# Patient Record
Sex: Male | Born: 1970 | Race: Black or African American | Hispanic: No | Marital: Single | Smoking: Current every day smoker
Health system: Southern US, Community
[De-identification: ages and names within clinical notes are randomized; demographics above are authoritative.]

---

## 2013-11-13 ENCOUNTER — Ambulatory Visit: Payer: Self-pay | Admitting: General Practice

## 2016-02-07 IMAGING — CR DG CHEST 2V
1 series · 2 of 2 positions shown · non-contrast
Comparison: None.

CLINICAL DATA: Health screening ; history of tobacco use

EXAM:
CHEST  2 VIEW

[Series 1: kdxr chest 2 views (person_name) · 0.14mm/px · 2 of 2 slices shown]
[im 1/2]
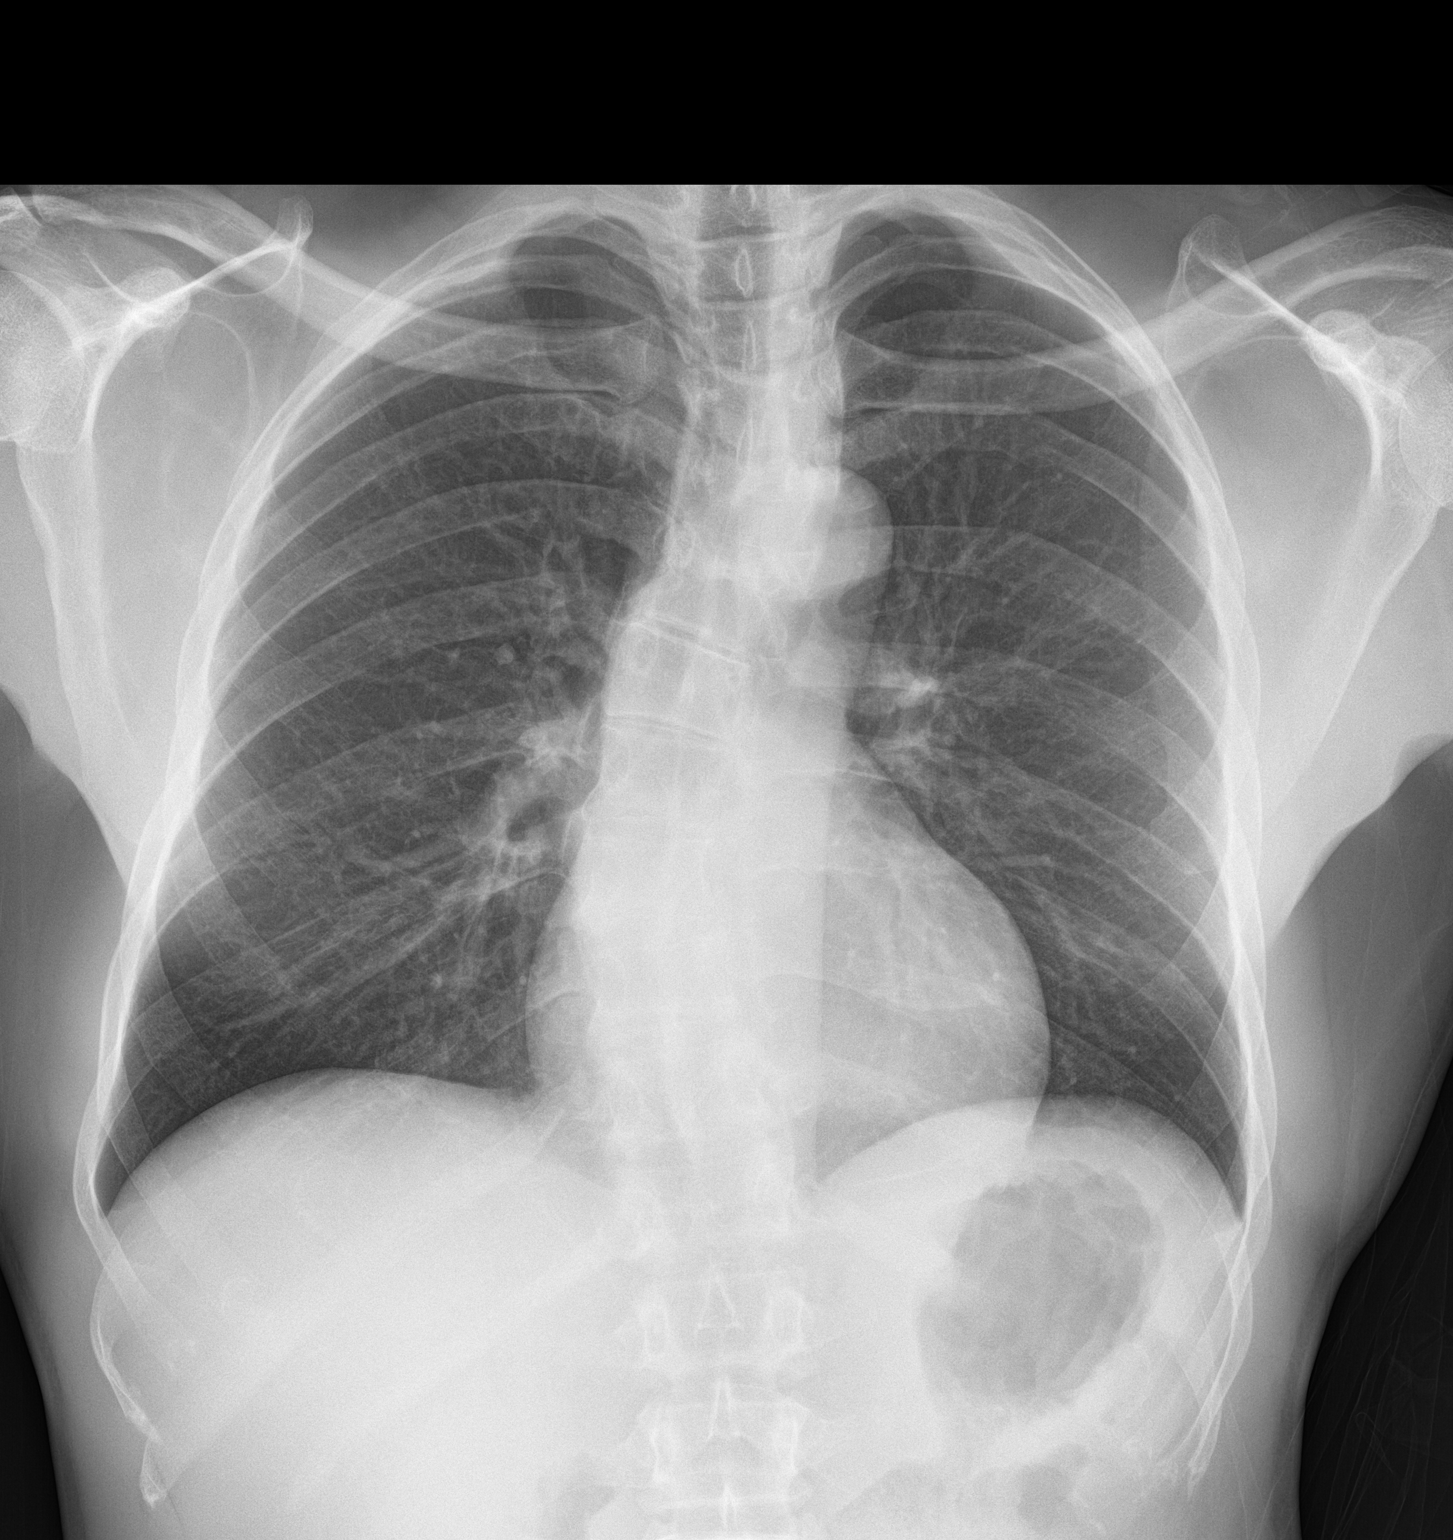
[im 2/2]
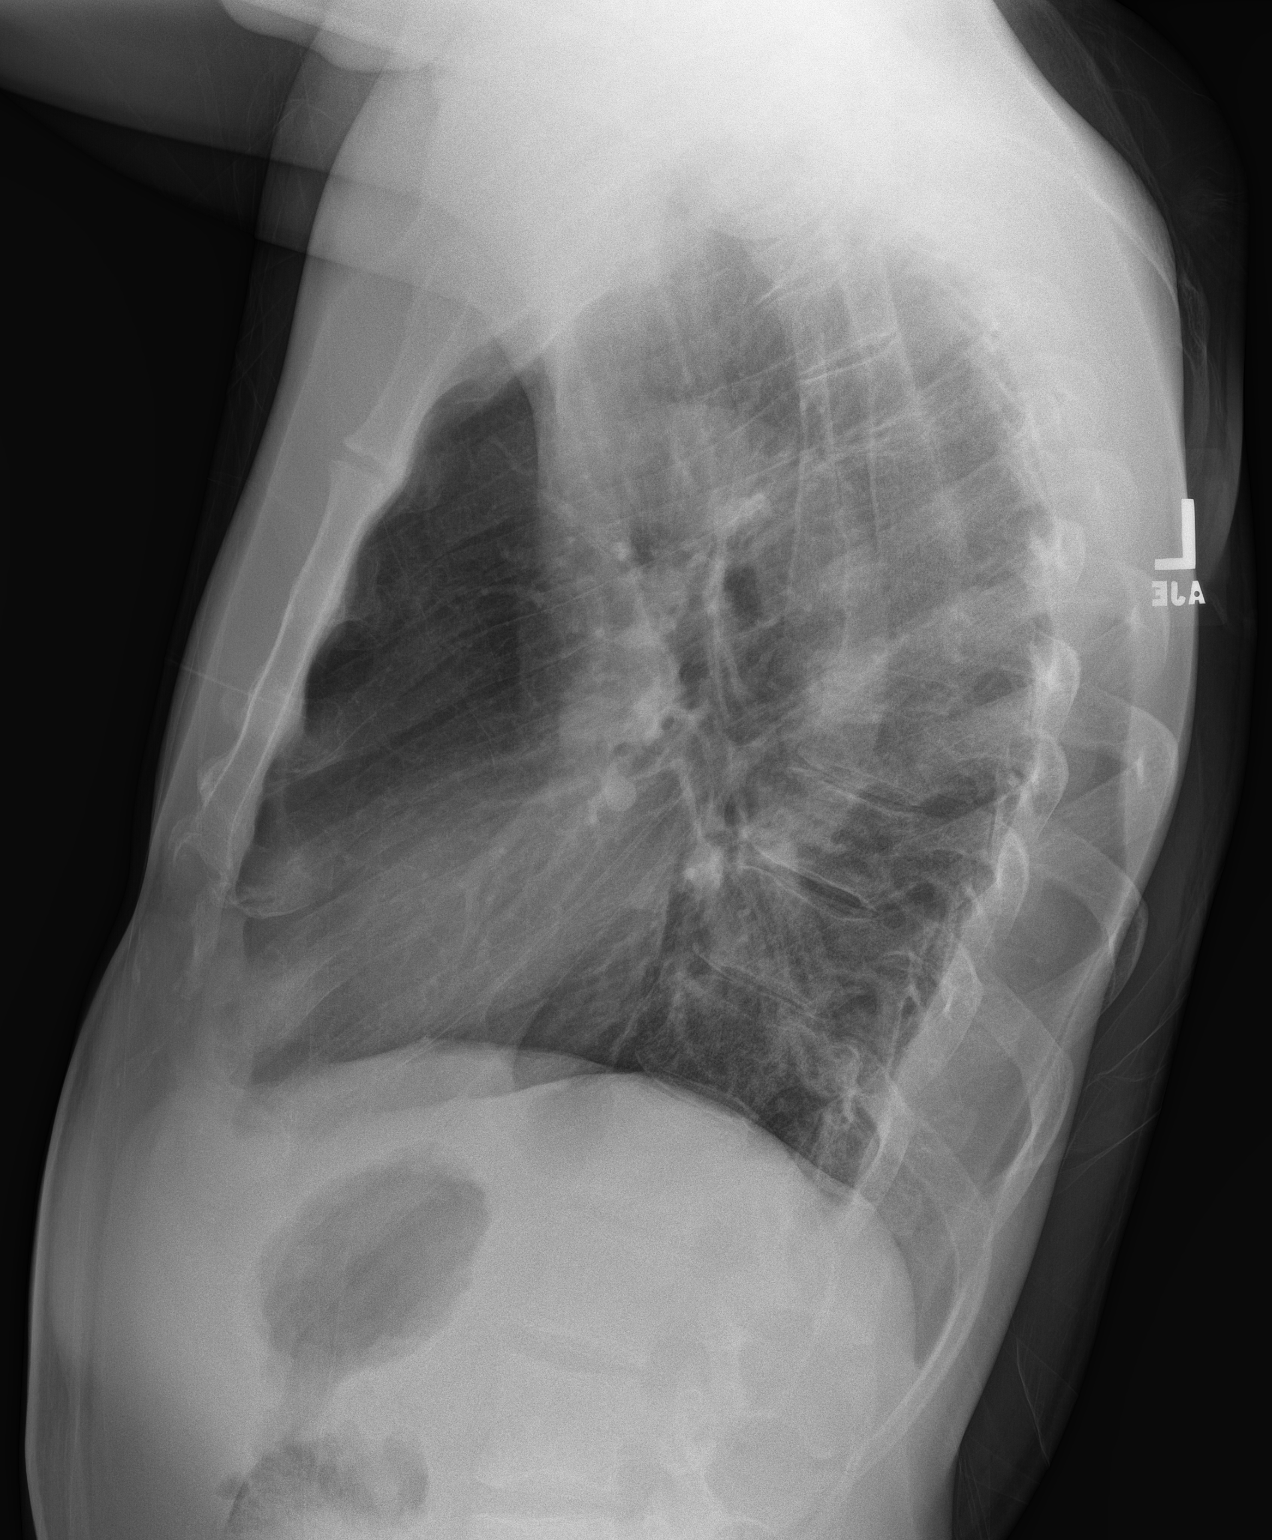

[2 of 2 positions shown; findings below may reference images not displayed]

FINDINGS: Lungs are clear. Heart size and pulmonary vascularity are normal. No
adenopathy. There is upper thoracic levoscoliosis with mid thoracic
dextroscoliosis.
IMPRESSION: Lungs clear.  There is scoliosis in the thoracic region.

## 2016-12-13 ENCOUNTER — Encounter: Payer: Self-pay | Admitting: Emergency Medicine

## 2016-12-13 ENCOUNTER — Emergency Department
Admission: EM | Admit: 2016-12-13 | Discharge: 2016-12-13 | Disposition: A | Discharge status: Home / Self Care | Attending: Emergency Medicine | Admitting: Emergency Medicine

## 2016-12-13 ENCOUNTER — Encounter
Admission: EM | Disposition: A | Discharge status: Home / Self Care | Payer: Self-pay | Source: Home / Self Care | Attending: Emergency Medicine

## 2016-12-13 ENCOUNTER — Emergency Department

## 2016-12-13 DIAGNOSIS — R079 Chest pain, unspecified: Secondary | ICD-10-CM

## 2016-12-13 DIAGNOSIS — R071 Chest pain on breathing: Secondary | ICD-10-CM | POA: Diagnosis present

## 2016-12-13 DIAGNOSIS — F17200 Nicotine dependence, unspecified, uncomplicated: Secondary | ICD-10-CM | POA: Diagnosis not present

## 2016-12-13 LAB — COMPREHENSIVE METABOLIC PANEL
ALBUMIN: 4.6 g/dL (ref 3.5–5.0)
ALT: 23 U/L (ref 17–63)
ANION GAP: 7 (ref 5–15)
AST: 21 U/L (ref 15–41)
Alkaline Phosphatase: 71 U/L (ref 38–126)
BUN: 11 mg/dL (ref 6–20)
CALCIUM: 9.6 mg/dL (ref 8.9–10.3)
CHLORIDE: 104 mmol/L (ref 101–111)
CO2: 29 mmol/L (ref 22–32)
Creatinine, Ser: 1.03 mg/dL (ref 0.61–1.24)
GFR calc non Af Amer: >60 mL/min (ref >60)
Glucose, Bld: 96 mg/dL (ref 65–99)
POTASSIUM: 3.9 mmol/L (ref 3.5–5.1)
SODIUM: 140 mmol/L (ref 135–145)
Total Bilirubin: 1.2 mg/dL (ref 0.3–1.2)
Total Protein: 7.6 g/dL (ref 6.5–8.1)

## 2016-12-13 LAB — CBC WITH DIFFERENTIAL/PLATELET
Basophils Absolute: 0.1 10*3/uL (ref 0–0.1)
Basophils Relative: 1 %
EOS ABS: 0.1 10*3/uL (ref 0–0.7)
Eosinophils Relative: 1 %
HCT: 41.2 % (ref 40.0–52.0)
HEMOGLOBIN: 13.8 g/dL (ref 13.0–18.0)
LYMPHS ABS: 2.9 10*3/uL (ref 1.0–3.6)
LYMPHS PCT: 59 %
MCH: 31.7 pg (ref 26.0–34.0)
MCHC: 33.4 g/dL (ref 32.0–36.0)
MCV: 94.8 fL (ref 80.0–100.0)
MONOS PCT: 9 %
Monocytes Absolute: 0.4 10*3/uL (ref 0.2–1.0)
NEUTROS PCT: 30 %
Neutro Abs: 1.5 10*3/uL (ref 1.4–6.5)
PLATELETS: 183 10*3/uL (ref 150–440)
RBC: 4.34 MIL/uL — ABNORMAL LOW (ref 4.40–5.90)
RDW: 13.3 % (ref 11.5–14.5)
WBC: 5 10*3/uL (ref 3.8–10.6)

## 2016-12-13 LAB — PROTIME-INR
INR: 1.01
PROTHROMBIN TIME: 13.2 s (ref 11.4–15.2)

## 2016-12-13 LAB — LIPID PANEL
CHOLESTEROL: 210 mg/dL — AB (ref 0–200)
HDL: 80 mg/dL (ref >40)
LDL Cholesterol: 121 mg/dL — ABNORMAL HIGH (ref 0–99)
TRIGLYCERIDES: 46 mg/dL (ref <150)
Total CHOL/HDL Ratio: 2.6 RATIO
VLDL: 9 mg/dL (ref 0–40)

## 2016-12-13 LAB — APTT

## 2016-12-13 LAB — TROPONIN I
Troponin I: 0.04 ng/mL (ref <0.03)
Troponin I: 0.03 ng/mL (ref <0.03)

## 2016-12-13 LAB — FIBRIN DERIVATIVES D-DIMER (ARMC ONLY): Fibrin derivatives D-dimer (ARMC): 217.07 ng/mL (FEU) (ref 0.00–499.00)

## 2016-12-13 SURGERY — CORONARY/GRAFT ACUTE MI REVASCULARIZATION
Anesthesia: Moderate Sedation

## 2016-12-13 MED ORDER — ASPIRIN 81 MG PO CHEW: 324.0000 mg | CHEWABLE_TABLET | Freq: Once | ORAL | Status: DC

## 2016-12-13 MED ORDER — NITROGLYCERIN 5 MG/ML IV SOLN
INTRAVENOUS | Status: AC
  Filled 2016-12-13: qty 10

## 2016-12-13 MED ORDER — LIDOCAINE HCL (PF) 1 % IJ SOLN
INTRAMUSCULAR | Status: AC
  Filled 2016-12-13: qty 30

## 2016-12-13 MED ORDER — GI COCKTAIL ~~LOC~~
30.0000 mL | Freq: Once | ORAL | Status: AC
  Administered 2016-12-13: 30 mL via ORAL
  Filled 2016-12-13: qty 30

## 2016-12-13 MED ORDER — HEPARIN SODIUM (PORCINE) 5000 UNIT/ML IJ SOLN
INTRAMUSCULAR | Status: AC
  Filled 2016-12-13: qty 1

## 2016-12-13 MED ORDER — KETOROLAC TROMETHAMINE 30 MG/ML IJ SOLN
30.0000 mg | Freq: Once | INTRAMUSCULAR | Status: AC
  Administered 2016-12-13: 30 mg via INTRAVENOUS
  Filled 2016-12-13: qty 1

## 2016-12-13 MED ORDER — HEPARIN SODIUM (PORCINE) 5000 UNIT/ML IJ SOLN
4000.0000 [IU] | Freq: Once | INTRAMUSCULAR | Status: AC
  Administered 2016-12-13: 4000 [IU] via INTRAVENOUS

## 2016-12-13 NOTE — Discharge Instructions (Signed)
Please follow up with to further evaluate your chest pain

## 2016-12-13 NOTE — ED Notes (Signed)
Pt given graham crackers, PB, and ginger ale.

## 2016-12-13 NOTE — ED Provider Notes (Signed)
[facility] Emergency Department Provider Note   ____________________________________________   First MD Initiated Contact with Patient 12/13/16 0210     (approximate)  I have reviewed the triage vital signs and the nursing notes.   HISTORY  Chief Complaint Code STEMI    HPI Burns Jenetta Downer Schreier is a 46 y.o. male who comes into the hospital today with some chest pain. The patient is currently residing in the correctional facility and calves will [place]. He states that he was laying down to sleep when around 11:30 he developed some left-sided chest pain. He reports that it only hurting when he takes a deep breath. The patient reports that the pain is a 7-8 out of 10 in intensity.He does not have any medical problems. He reports that with the pain he does not have any shortness of breath, nausea, vomiting, sweats dizziness or lightheadedness. The pain does not radiate or move anywhere. The patient did receive an EKG and EMS was concerned about a possible ST segment elevation MI. the patient was brought in under alert given his chest pain.   History reviewed. No pertinent past medical history.  There are no active problems to display for this patient.   History reviewed. No pertinent surgical history.  Prior to Admission medications   Not on File    Allergies Patient has no known allergies.  No family history on file.  Social History Social History  Substance Use Topics  . Smoking status: Current Every Day Smoker  . Smokeless tobacco: Not on file  . Alcohol use No    Review of Systems  Constitutional: No fever/chills Eyes: No visual changes. ENT: No sore throat. Cardiovascular:  chest pain. Respiratory: Denies shortness of breath. Gastrointestinal: No abdominal pain.  No nausea, no vomiting.  No diarrhea.  No constipation. Genitourinary: Negative for dysuria. Musculoskeletal: Negative for back pain. Skin: Negative for rash. Neurological:  Negative for headaches, focal weakness or numbness.   ____________________________________________   PHYSICAL EXAM:  VITAL SIGNS: ED Triage Vitals  Enc Vitals Group     BP 12/13/16 0214 (!) 163/104     Pulse Rate 12/13/16 0214 (!) 56     Resp 12/13/16 0214 12     Temp 12/13/16 0214 98.2 F (36.8 C)     Temp Source 12/13/16 0214 Oral     SpO2 12/13/16 0214 100 %     Weight 12/13/16 0217 156 lb 4.8 oz (70.9 kg)     Height 12/13/16 0217 5' 6"  (1.676 m)     Head Circumference --      Peak Flow --      Pain Score 12/13/16 0213 8     Pain Loc --      Pain Edu? --      Excl. in [place]? --     Constitutional: Alert and oriented. Well appearing and in mild distress. Eyes: Conjunctivae are normal. PERRL. EOMI. Head: Atraumatic. Nose: No congestion/rhinnorhea. Mouth/Throat: Mucous membranes are moist.  Oropharynx non-erythematous. Cardiovascular: Normal rate, regular rhythm. Grossly normal heart sounds.  Good peripheral circulation. Respiratory: Normal respiratory effort.  No retractions. Lungs CTAB. Gastrointestinal: Soft and nontender. No distention. positive bowel sounds Musculoskeletal: No lower extremity tenderness nor edema.  Neurologic:  Normal speech and language.  Skin:  Skin is warm, dry and intact.  Psychiatric: Mood and affect are normal.   ____________________________________________   LABS (all labs ordered are listed, but only abnormal results are displayed)  Labs Reviewed  CBC WITH DIFFERENTIAL/PLATELET - Abnormal;  Notable for the following:       Result Value   RBC 4.34 (*)    All other components within normal limits  APTT - Abnormal; Notable for the following:    aPTT >160 (*)    All other components within normal limits  TROPONIN I - Abnormal; Notable for the following:    Troponin I 0.04 (*)    All other components within normal limits  LIPID PANEL - Abnormal; Notable for the following:    Cholesterol 210 (*)    LDL Cholesterol 121 (*)    All other  components within normal limits  TROPONIN I - Abnormal; Notable for the following:    Troponin I 0.03 (*)    All other components within normal limits  PROTIME-INR  COMPREHENSIVE METABOLIC PANEL  FIBRIN DERIVATIVES D-DIMER ([facility] ONLY)   ____________________________________________  EKG  ED ECG REPORT I, Loney Hering, the attending physician, personally viewed and interpreted this ECG.   Date: 12/13/2016  EKG Time: 216  Rate: 56  Rhythm: normal sinus rhythm  Axis: normal  Intervals:none  ST&T Change: no STEMI  ____________________________________________  RADIOLOGY  Dg Chest 2 View  Result Date: 12/13/2016 CLINICAL DATA:  LEFT chest pain since last night. EXAM: CHEST  2 VIEW COMPARISON:  Chest radiograph November 13, 2013 FINDINGS: Cardiomediastinal silhouette is normal. No pleural effusions or focal consolidations. Trachea projects midline and there is no pneumothorax. Soft tissue planes and included osseous structures are non-suspicious. Unchanged moderate lower thoracic dextroscoliosis. IMPRESSION: Stable examination:  No acute cardiopulmonary process. Electronically Signed   By: Elon Alas M.D.   On: 12/13/2016 02:57    ____________________________________________   PROCEDURES  Procedure(s) performed: None  Procedures  Critical Care performed: No  ____________________________________________   INITIAL IMPRESSION / ASSESSMENT AND PLAN / ED COURSE  As part of my medical decision making, I reviewed the following data within the electronic MEDICAL RECORD NUMBER Notes from prior ED visits and [place] Controlled Substance Database   This is a 46 year old male who comes into the hospital today with some chest pain. The patient was brought in with a concern for STEMI  My differential diagnosis includes STEMI, NSTEMI, nonspecific chest pain, GERD  When the patient arrived we did evaluate the EKG from EMS and it did not meet STEMI criteria. The patient had one  box of elevation in V2 and V3 with no reciprocal changes. We did repeat the EKG which again did not meet STEMI criteria. The patient also was seen by Dr. Towanda Malkin the cardiologist on-call and he also did not feel that the EKG met STEMI criteria. We did give the patient a dose of heparin when he initially arrived with a concern for STEMI but did not give any further medications. We did check some blood work to include a d-dimer. The patient did receive some aspirin by EMS and some Toradol and GI cocktail here in the emergency department. The patient's blood work and workup was unremarkable. We also repeated the bone and it was negative. The patient will be discharged back to his facility and should follow up with cardiology.      ____________________________________________   FINAL CLINICAL IMPRESSION(S) / ED DIAGNOSES  Final diagnoses:  Chest pain on breathing  Chest pain, unspecified type      NEW MEDICATIONS STARTED DURING THIS VISIT:  New Prescriptions   No medications on file     Note:  This document was prepared using Dragon voice recognition software and may include  unintentional dictation errors.    Loney Hering, MD 12/13/16 [id number]

## 2016-12-13 NOTE — ED Notes (Signed)
AAOx3.  Skin warm and dry.  NAD 

## 2016-12-13 NOTE — ED Triage Notes (Signed)
Pt reported left sided chest pain which began around 2330 this evening. Pt received 324 mg aspirin via [facility]. [facility]. Upon this time in triage, forensic restraints removed. Pt denies other cardiac symptoms at this time and is in NAD.

## 2019-03-09 IMAGING — CR DG CHEST 2V
1 series · 2 of 2 positions shown · non-contrast
Comparison: Chest radiograph November 13, 2013

CLINICAL DATA: LEFT chest pain since last night.

EXAM:
CHEST  2 VIEW

[Series 1: dg chest 2 view · 0.14mm/px · 2 of 2 slices shown]
[im 1/2]
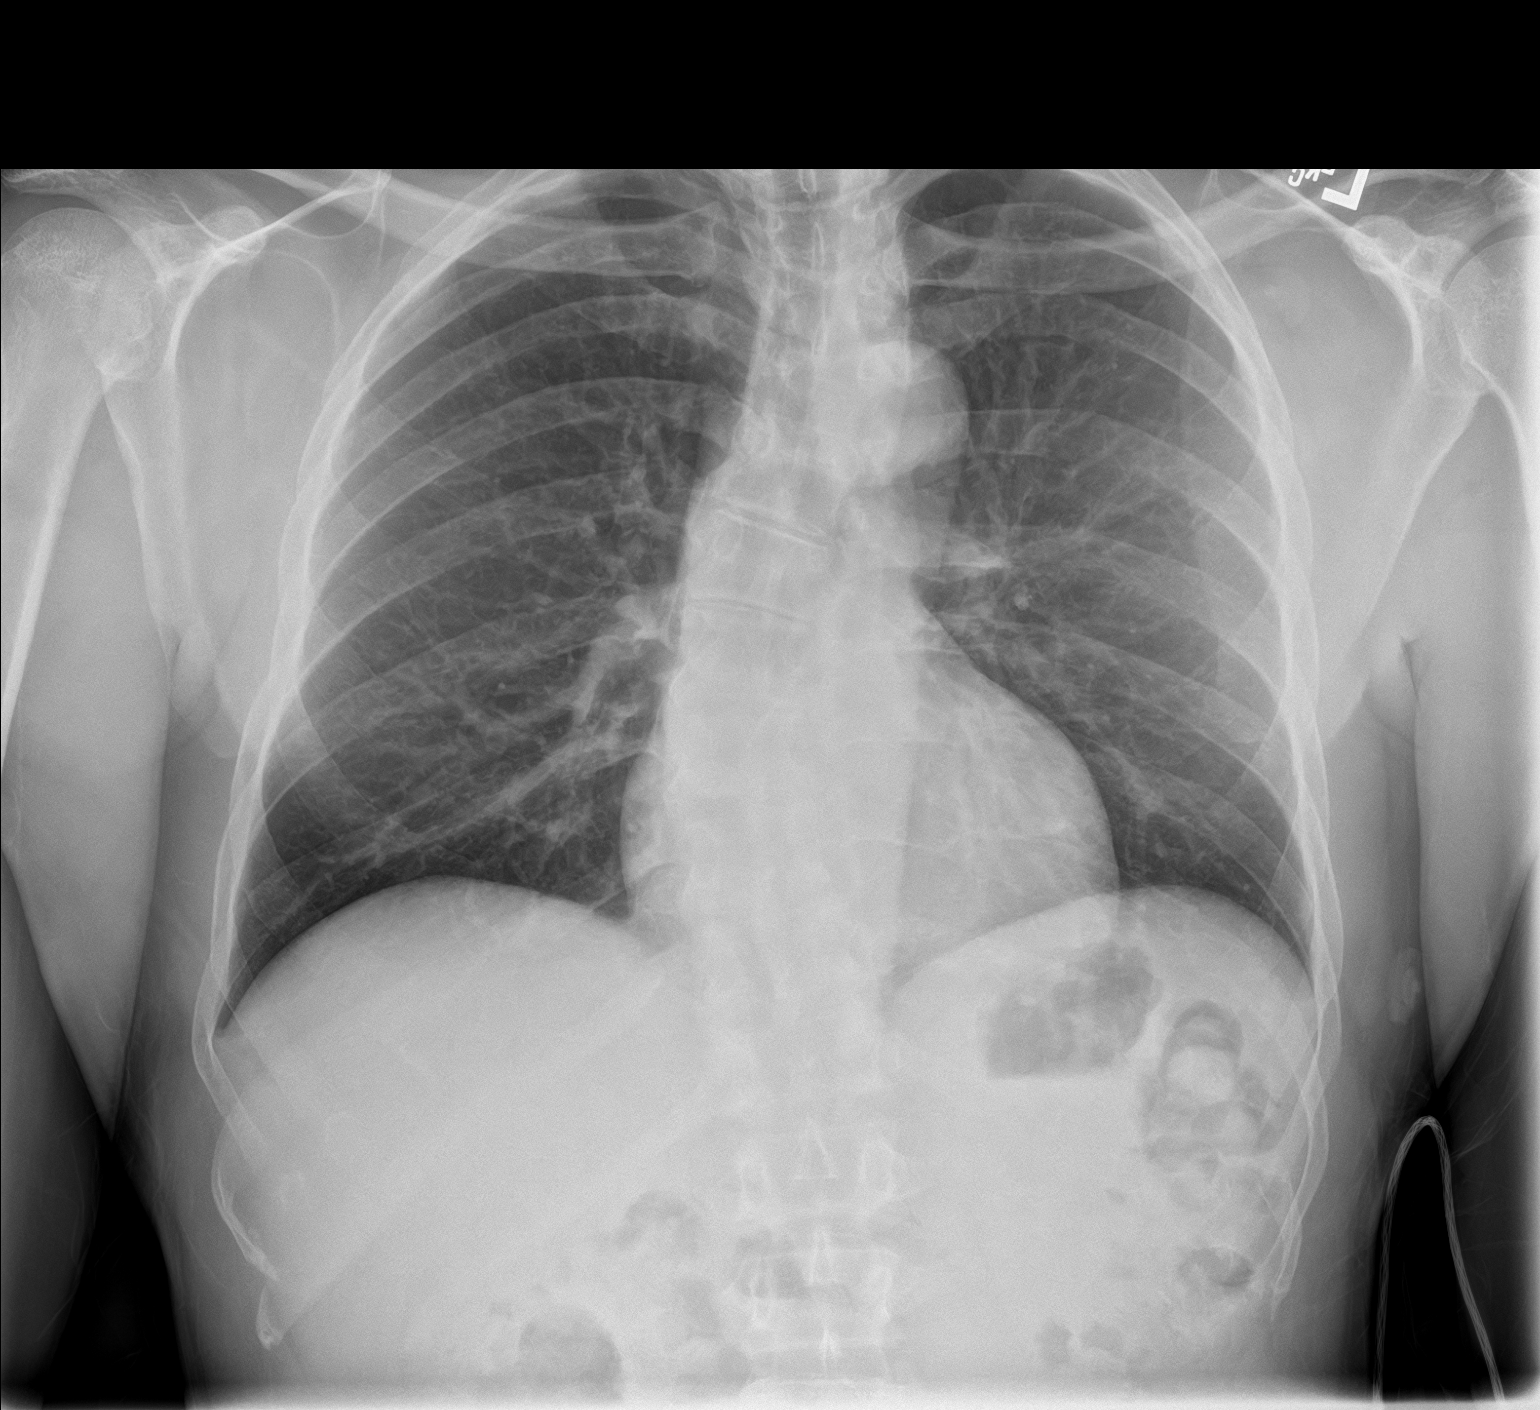
[im 2/2]
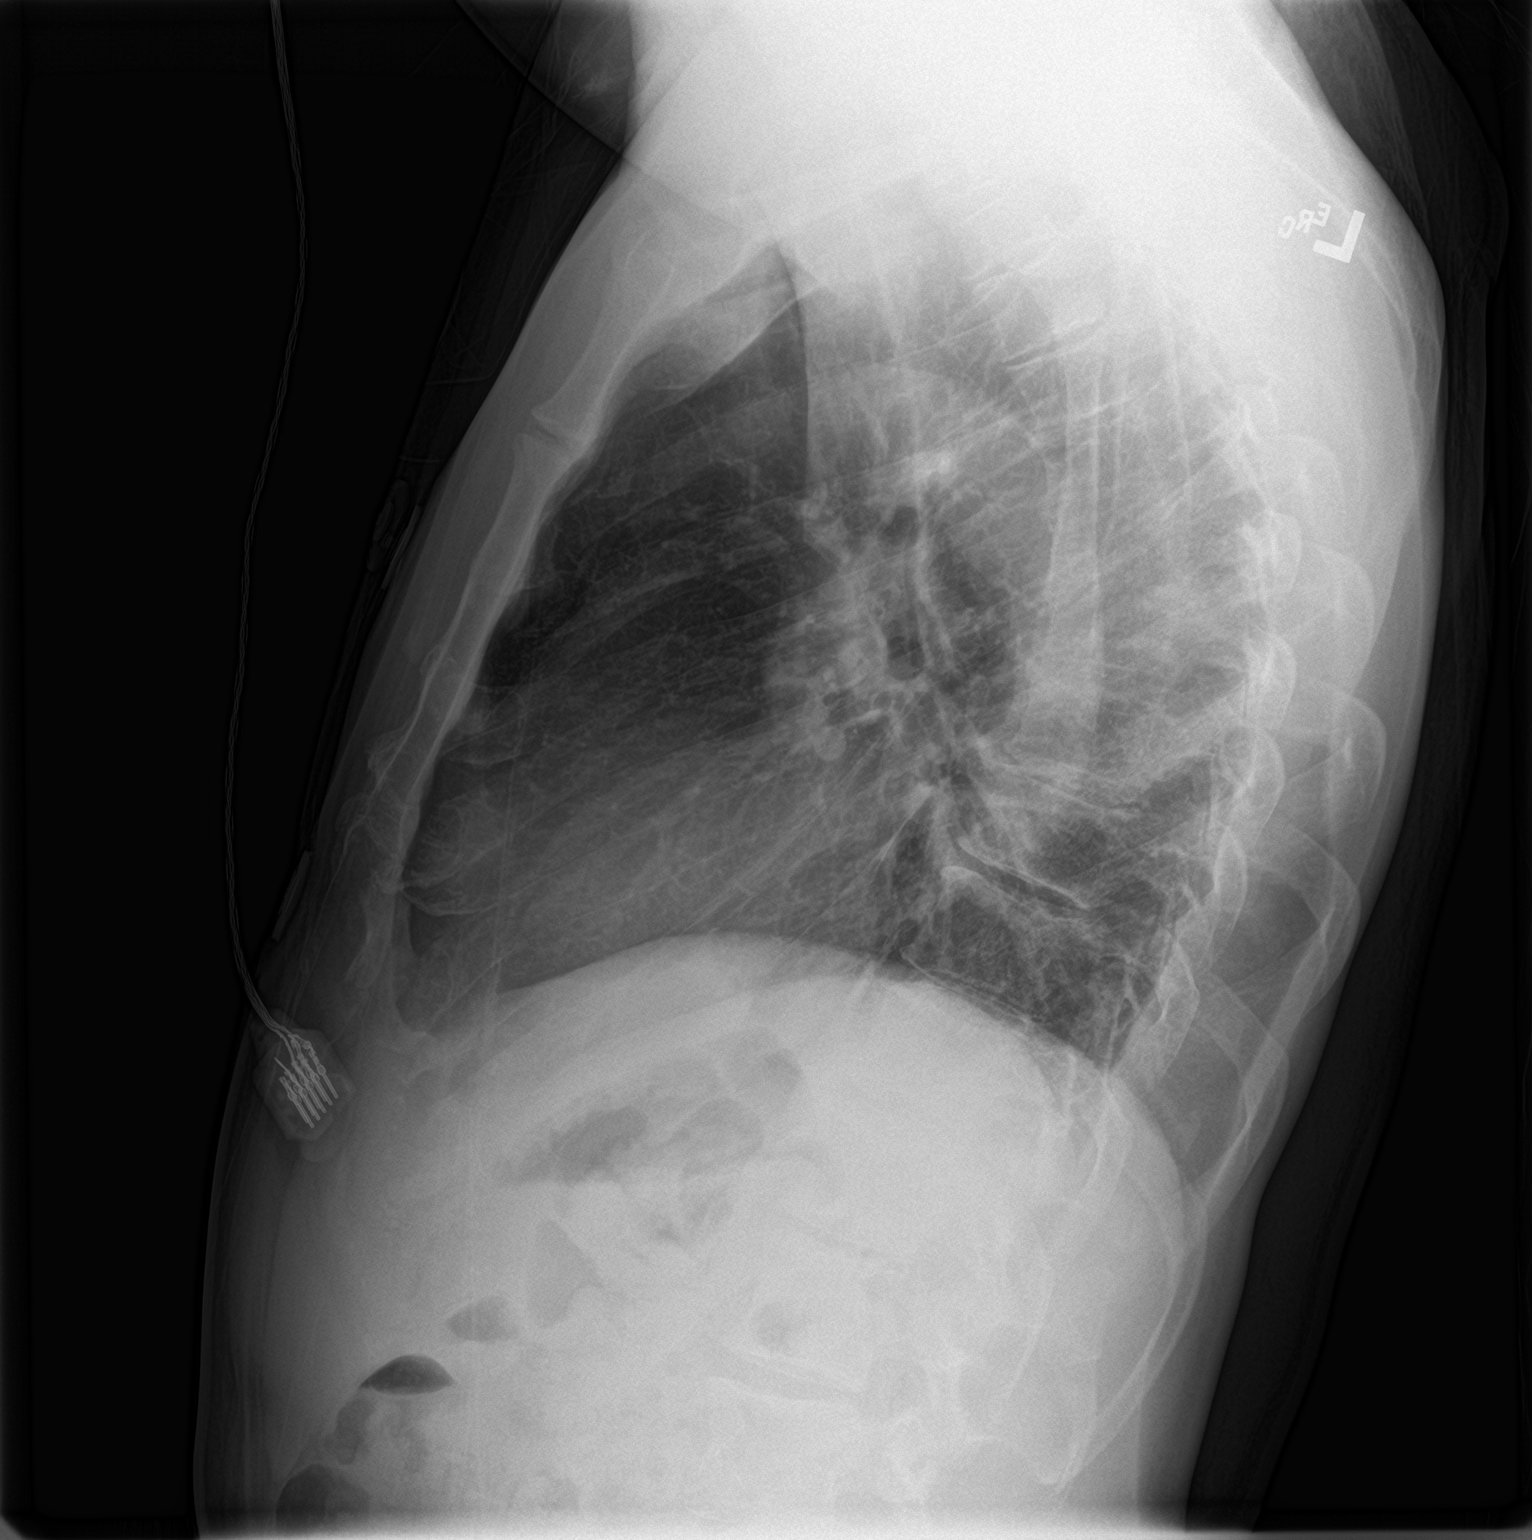

[2 of 2 positions shown; findings below may reference images not displayed]

FINDINGS: Cardiomediastinal silhouette is normal. No pleural effusions or
focal consolidations. Trachea projects midline and there is no
pneumothorax. Soft tissue planes and included osseous structures are
non-suspicious. Unchanged moderate lower thoracic dextroscoliosis.
IMPRESSION: Stable examination:  No acute cardiopulmonary process.
